# Patient Record
Sex: Male | Born: 1975 | Race: White | Hispanic: No | Marital: Single | Smoking: Current every day smoker
Health system: Southern US, Community
[De-identification: ages and names within clinical notes are randomized; demographics above are authoritative.]

## PROBLEM LIST (undated history)

## (undated) DIAGNOSIS — F1910 Other psychoactive substance abuse, uncomplicated: Secondary | ICD-10-CM

---

## 2014-03-18 ENCOUNTER — Encounter (HOSPITAL_COMMUNITY)
Admission: EM | Disposition: A | Discharge status: Home / Self Care | Payer: Self-pay | Source: Home / Self Care | Attending: Emergency Medicine

## 2014-03-18 ENCOUNTER — Emergency Department (HOSPITAL_COMMUNITY): Payer: Self-pay

## 2014-03-18 ENCOUNTER — Emergency Department (HOSPITAL_COMMUNITY)
Admission: EM | Admit: 2014-03-18 | Discharge: 2014-03-18 | Disposition: A | Discharge status: Home / Self Care | Payer: Self-pay | Attending: Emergency Medicine | Admitting: Emergency Medicine

## 2014-03-18 ENCOUNTER — Emergency Department (HOSPITAL_COMMUNITY): Payer: Self-pay | Admitting: Anesthesiology

## 2014-03-18 ENCOUNTER — Encounter (HOSPITAL_COMMUNITY): Payer: Self-pay | Admitting: Emergency Medicine

## 2014-03-18 DIAGNOSIS — S81811A Laceration without foreign body, right lower leg, initial encounter: Secondary | ICD-10-CM | POA: Insufficient documentation

## 2014-03-18 DIAGNOSIS — Z01811 Encounter for preprocedural respiratory examination: Secondary | ICD-10-CM

## 2014-03-18 DIAGNOSIS — Y929 Unspecified place or not applicable: Secondary | ICD-10-CM | POA: Insufficient documentation

## 2014-03-18 DIAGNOSIS — W312XXA Contact with powered woodworking and forming machines, initial encounter: Secondary | ICD-10-CM | POA: Insufficient documentation

## 2014-03-18 DIAGNOSIS — F17210 Nicotine dependence, cigarettes, uncomplicated: Secondary | ICD-10-CM | POA: Insufficient documentation

## 2014-03-18 HISTORY — PX: I&D EXTREMITY: SHX5045

## 2014-03-18 HISTORY — DX: Other psychoactive substance abuse, uncomplicated: F19.10

## 2014-03-18 LAB — CBC WITH DIFFERENTIAL/PLATELET
Basophils Absolute: 0 10*3/uL (ref 0.0–0.1)
Basophils Relative: 0 % (ref 0–1)
Eosinophils Absolute: 0.1 10*3/uL (ref 0.0–0.7)
Eosinophils Relative: 1 % (ref 0–5)
HCT: 36.2 % — ABNORMAL LOW (ref 39.0–52.0)
Hemoglobin: 12.7 g/dL — ABNORMAL LOW (ref 13.0–17.0)
Lymphocytes Relative: 15 % (ref 12–46)
Lymphs Abs: 1.8 10*3/uL (ref 0.7–4.0)
MCH: 30.3 pg (ref 26.0–34.0)
MCHC: 35.1 g/dL (ref 30.0–36.0)
MCV: 86.4 fL (ref 78.0–100.0)
Monocytes Absolute: 0.7 10*3/uL (ref 0.1–1.0)
Monocytes Relative: 6 % (ref 3–12)
Neutro Abs: 9.1 10*3/uL — ABNORMAL HIGH (ref 1.7–7.7)
Neutrophils Relative %: 78 % — ABNORMAL HIGH (ref 43–77)
Platelets: ADEQUATE 10*3/uL (ref 150–400)
RBC: 4.19 MIL/uL — ABNORMAL LOW (ref 4.22–5.81)
RDW: 12.9 % (ref 11.5–15.5)
WBC: 11.7 10*3/uL — ABNORMAL HIGH (ref 4.0–10.5)

## 2014-03-18 LAB — BASIC METABOLIC PANEL
Anion gap: 12 (ref 5–15)
BUN: 9 mg/dL (ref 6–23)
CO2: 20 mmol/L (ref 19–32)
Calcium: 9.8 mg/dL (ref 8.4–10.5)
Chloride: 107 mmol/L (ref 96–112)
Creatinine, Ser: 0.8 mg/dL (ref 0.50–1.35)
GFR calc Af Amer: >90 mL/min (ref >90)
GFR calc non Af Amer: >90 mL/min (ref >90)
Glucose, Bld: 112 mg/dL — ABNORMAL HIGH (ref 70–99)
Potassium: 5.5 mmol/L — ABNORMAL HIGH (ref 3.5–5.1)
Sodium: 139 mmol/L (ref 135–145)

## 2014-03-18 SURGERY — IRRIGATION AND DEBRIDEMENT EXTREMITY
Anesthesia: General | Laterality: Right

## 2014-03-18 MED ORDER — FENTANYL CITRATE 0.05 MG/ML IJ SOLN
INTRAMUSCULAR | Status: AC
  Filled 2014-03-18: qty 5

## 2014-03-18 MED ORDER — MIDAZOLAM HCL 5 MG/5ML IJ SOLN
INTRAMUSCULAR | Status: DC | PRN
  Administered 2014-03-18 (×2): 2 mg via INTRAVENOUS

## 2014-03-18 MED ORDER — MIDAZOLAM HCL 2 MG/2ML IJ SOLN
INTRAMUSCULAR | Status: AC
  Filled 2014-03-18: qty 2

## 2014-03-18 MED ORDER — FENTANYL CITRATE 0.05 MG/ML IJ SOLN
INTRAMUSCULAR | Status: DC | PRN
  Administered 2014-03-18: 50 ug via INTRAVENOUS
  Administered 2014-03-18: 100 ug via INTRAVENOUS

## 2014-03-18 MED ORDER — PROPOFOL INFUSION 10 MG/ML OPTIME
INTRAVENOUS | Status: DC | PRN
  Administered 2014-03-18: 30 mL via INTRAVENOUS

## 2014-03-18 MED ORDER — SODIUM CHLORIDE 0.9 % IR SOLN
Status: DC | PRN
  Administered 2014-03-18: 3000 mL

## 2014-03-18 MED ORDER — PROPOFOL 10 MG/ML IV BOLUS
INTRAVENOUS | Status: AC
  Filled 2014-03-18: qty 20

## 2014-03-18 MED ORDER — CEFAZOLIN SODIUM-DEXTROSE 2-3 GM-% IV SOLR
INTRAVENOUS | Status: DC | PRN
  Administered 2014-03-18: 2 g via INTRAVENOUS

## 2014-03-18 MED ORDER — MEPERIDINE HCL 25 MG/ML IJ SOLN: 6.2500 mg | INTRAMUSCULAR | Status: DC | PRN

## 2014-03-18 MED ORDER — KETOROLAC TROMETHAMINE 30 MG/ML IJ SOLN
INTRAMUSCULAR | Status: AC
  Filled 2014-03-18: qty 1

## 2014-03-18 MED ORDER — HYDROMORPHONE HCL 1 MG/ML IJ SOLN
1.0000 mg | Freq: Once | INTRAMUSCULAR | Status: AC
Start: 2014-03-18 — End: 2014-03-18
  Administered 2014-03-18: 1 mg via INTRAVENOUS
  Filled 2014-03-18: qty 1

## 2014-03-18 MED ORDER — HYDROMORPHONE HCL 1 MG/ML IJ SOLN
1.0000 mg | Freq: Once | INTRAMUSCULAR | Status: AC
  Administered 2014-03-18: 1 mg via INTRAVENOUS
  Filled 2014-03-18: qty 1

## 2014-03-18 MED ORDER — ONDANSETRON HCL 4 MG/2ML IJ SOLN: 4.0000 mg | Freq: Once | INTRAMUSCULAR | Status: DC | PRN

## 2014-03-18 MED ORDER — KETOROLAC TROMETHAMINE 30 MG/ML IJ SOLN
30.0000 mg | Freq: Once | INTRAMUSCULAR | Status: AC
  Administered 2014-03-18: 30 mg via INTRAVENOUS

## 2014-03-18 MED ORDER — CEFAZOLIN SODIUM-DEXTROSE 2-3 GM-% IV SOLR
INTRAVENOUS | Status: AC
  Filled 2014-03-18: qty 100

## 2014-03-18 MED ORDER — CEFAZOLIN SODIUM 1-5 GM-% IV SOLN
1.0000 g | Freq: Once | INTRAVENOUS | Status: AC
  Administered 2014-03-18: 1 g via INTRAVENOUS
  Filled 2014-03-18: qty 50

## 2014-03-18 MED ORDER — LIDOCAINE-EPINEPHRINE (PF) 1.5 %-1:200000 IJ SOLN
INTRAMUSCULAR | Status: DC | PRN
  Administered 2014-03-18: 30 mL via PERINEURAL

## 2014-03-18 MED ORDER — FENTANYL CITRATE 0.05 MG/ML IJ SOLN
INTRAMUSCULAR | Status: AC
  Administered 2014-03-18: 100 ug via NASAL
  Filled 2014-03-18: qty 2

## 2014-03-18 MED ORDER — ONDANSETRON HCL 4 MG/2ML IJ SOLN
INTRAMUSCULAR | Status: DC | PRN
  Administered 2014-03-18: 4 mg via INTRAVENOUS

## 2014-03-18 MED ORDER — 0.9 % SODIUM CHLORIDE (POUR BTL) OPTIME
TOPICAL | Status: DC | PRN
  Administered 2014-03-18: 1000 mL

## 2014-03-18 MED ORDER — HYDROMORPHONE HCL 1 MG/ML IJ SOLN
2.0000 mg | Freq: Once | INTRAMUSCULAR | Status: AC
  Administered 2014-03-18: 2 mg via INTRAVENOUS
  Filled 2014-03-18: qty 2

## 2014-03-18 MED ORDER — TETANUS-DIPHTH-ACELL PERTUSSIS 5-2.5-18.5 LF-MCG/0.5 IM SUSP
0.5000 mL | Freq: Once | INTRAMUSCULAR | Status: DC
  Filled 2014-03-18: qty 0.5

## 2014-03-18 MED ORDER — HYDROMORPHONE HCL 1 MG/ML IJ SOLN: 0.2500 mg | INTRAMUSCULAR | Status: DC | PRN

## 2014-03-18 MED ORDER — BUPIVACAINE-EPINEPHRINE (PF) 0.5% -1:200000 IJ SOLN
INTRAMUSCULAR | Status: DC | PRN
  Administered 2014-03-18: 30 mL via PERINEURAL

## 2014-03-18 MED ORDER — ONDANSETRON HCL 4 MG/2ML IJ SOLN
INTRAMUSCULAR | Status: AC
  Filled 2014-03-18: qty 2

## 2014-03-18 MED ORDER — KETOROLAC TROMETHAMINE 30 MG/ML IM SOLN: 30.0000 mg | Freq: Once | INTRAMUSCULAR | Status: AC

## 2014-03-18 MED ORDER — PROPOFOL INFUSION 10 MG/ML OPTIME
INTRAVENOUS | Status: DC | PRN
  Administered 2014-03-18: 75 ug/kg/min via INTRAVENOUS

## 2014-03-18 MED ORDER — LACTATED RINGERS IV SOLN
INTRAVENOUS | Status: DC | PRN
  Administered 2014-03-18: 19:00:00 via INTRAVENOUS

## 2014-03-18 SURGICAL SUPPLY — 54 items
BANDAGE ELASTIC 4 VELCRO ST LF (GAUZE/BANDAGES/DRESSINGS) ×2 IMPLANT
BANDAGE ELASTIC 6 VELCRO ST LF (GAUZE/BANDAGES/DRESSINGS) ×2 IMPLANT
BNDG COHESIVE 4X5 TAN STRL (GAUZE/BANDAGES/DRESSINGS) ×2 IMPLANT
BNDG GAUZE ELAST 4 BULKY (GAUZE/BANDAGES/DRESSINGS) ×2 IMPLANT
COVER SURGICAL LIGHT HANDLE (MISCELLANEOUS) ×3 IMPLANT
CUFF TOURNIQUET SINGLE 18IN (TOURNIQUET CUFF) ×1 IMPLANT
CUFF TOURNIQUET SINGLE 24IN (TOURNIQUET CUFF) IMPLANT
CUFF TOURNIQUET SINGLE 34IN LL (TOURNIQUET CUFF) IMPLANT
CUFF TOURNIQUET SINGLE 44IN (TOURNIQUET CUFF) IMPLANT
DRAPE U-SHAPE 47X51 STRL (DRAPES) ×3 IMPLANT
DRSG PAD ABDOMINAL 8X10 ST (GAUZE/BANDAGES/DRESSINGS) ×4 IMPLANT
DURAPREP 26ML APPLICATOR (WOUND CARE) ×1 IMPLANT
ELECT REM PT RETURN 9FT ADLT (ELECTROSURGICAL) ×3
ELECTRODE REM PT RTRN 9FT ADLT (ELECTROSURGICAL) IMPLANT
FACESHIELD WRAPAROUND (MASK) ×3 IMPLANT
FACESHIELD WRAPAROUND OR TEAM (MASK) ×1 IMPLANT
GAUZE SPONGE 4X4 12PLY STRL (GAUZE/BANDAGES/DRESSINGS) ×2 IMPLANT
GAUZE XEROFORM 5X9 LF (GAUZE/BANDAGES/DRESSINGS) ×2 IMPLANT
GLOVE BIOGEL PI IND STRL 8 (GLOVE) ×1 IMPLANT
GLOVE BIOGEL PI INDICATOR 8 (GLOVE) ×2
GLOVE SURG ORTHO 8.0 STRL STRW (GLOVE) ×3 IMPLANT
GOWN STRL REUS W/ TWL LRG LVL3 (GOWN DISPOSABLE) ×2 IMPLANT
GOWN STRL REUS W/ TWL XL LVL3 (GOWN DISPOSABLE) ×1 IMPLANT
GOWN STRL REUS W/TWL LRG LVL3 (GOWN DISPOSABLE) ×6
GOWN STRL REUS W/TWL XL LVL3 (GOWN DISPOSABLE) ×3
HANDPIECE INTERPULSE COAX TIP (DISPOSABLE)
KIT BASIN OR (CUSTOM PROCEDURE TRAY) ×3 IMPLANT
KIT ROOM TURNOVER OR (KITS) ×3 IMPLANT
MANIFOLD NEPTUNE II (INSTRUMENTS) ×3 IMPLANT
NS IRRIG 1000ML POUR BTL (IV SOLUTION) ×3 IMPLANT
PACK ORTHO EXTREMITY (CUSTOM PROCEDURE TRAY) ×3 IMPLANT
PAD ARMBOARD 7.5X6 YLW CONV (MISCELLANEOUS) ×6 IMPLANT
PAD CAST 4YDX4 CTTN HI CHSV (CAST SUPPLIES) IMPLANT
PADDING CAST ABS 4INX4YD NS (CAST SUPPLIES) ×2
PADDING CAST ABS COTTON 4X4 ST (CAST SUPPLIES) IMPLANT
PADDING CAST COTTON 4X4 STRL (CAST SUPPLIES)
SET HNDPC FAN SPRY TIP SCT (DISPOSABLE) IMPLANT
SPONGE LAP 18X18 X RAY DECT (DISPOSABLE) ×5 IMPLANT
SPONGE LAP 4X18 X RAY DECT (DISPOSABLE) ×1 IMPLANT
STOCKINETTE IMPERVIOUS 9X36 MD (GAUZE/BANDAGES/DRESSINGS) ×3 IMPLANT
SUT ETHILON 2 0 FS 18 (SUTURE) ×4 IMPLANT
SUT ETHILON 3 0 PS 1 (SUTURE) IMPLANT
SUT ETHILON 4 0 PS 2 18 (SUTURE) IMPLANT
SUT PROLENE 3 0 PS 2 (SUTURE) IMPLANT
SUT VIC AB 3-0 SH 27 (SUTURE)
SUT VIC AB 3-0 SH 27X BRD (SUTURE) IMPLANT
TOWEL OR 17X24 6PK STRL BLUE (TOWEL DISPOSABLE) ×3 IMPLANT
TOWEL OR 17X26 10 PK STRL BLUE (TOWEL DISPOSABLE) ×3 IMPLANT
TUBE ANAEROBIC SPECIMEN COL (MISCELLANEOUS) IMPLANT
TUBE CONNECTING 12'X1/4 (SUCTIONS) ×1
TUBE CONNECTING 12X1/4 (SUCTIONS) ×2 IMPLANT
UNDERPAD 30X30 INCONTINENT (UNDERPADS AND DIAPERS) ×3 IMPLANT
WATER STERILE IRR 1000ML POUR (IV SOLUTION) ×3 IMPLANT
YANKAUER SUCT BULB TIP NO VENT (SUCTIONS) ×3 IMPLANT

## 2014-03-18 NOTE — Progress Notes (Signed)
Orthopedic Tech Progress Note Patient Details:  Caleb Gilbert 05-Aug-1975 [id number] Fit pt. for crutches and taught use of same. Ortho Devices Type of Ortho Device: Crutches Ortho Device/Splint Interventions: Adjustment   Lesle ChrisGilliland, Karra Pink L 03/18/2014, 9:26 PM

## 2014-03-18 NOTE — Anesthesia Postprocedure Evaluation (Signed)
Anesthesia Post Note  Patient: Caleb Gilbert  Procedure(s) Performed: Procedure(s) (LRB): IRRIGATION AND DEBRIDEMENT CLOSURE LEG (Right)  Anesthesia type: general  Patient location: PACU  Post pain: Pain level controlled  Post assessment: Patient's Cardiovascular Status Stable  Last Vitals:  Filed Vitals:   03/18/14 2030  BP:   Pulse: 54  Temp:   Resp: 12    Post vital signs: Reviewed and stable  Level of consciousness: sedated  Complications: No apparent anesthesia complications

## 2014-03-18 NOTE — ED Notes (Signed)
Pt up in tree and hit right calf with chain saw.

## 2014-03-18 NOTE — Anesthesia Preprocedure Evaluation (Addendum)
Anesthesia Evaluation  Patient identified by MRN, date of birth, ID band Patient awake    Reviewed: Allergy & Precautions, NPO status , Patient's Chart, lab work & pertinent test results  Airway Mallampati: I  TM Distance: >3 FB Neck ROM: Full    Dental  (+) Poor Dentition   Pulmonary Current Smoker,          Cardiovascular     Neuro/Psych    GI/Hepatic   Endo/Other    Renal/GU      Musculoskeletal   Abdominal   Peds  Hematology   Anesthesia Other Findings   Reproductive/Obstetrics                            Anesthesia Physical Anesthesia Plan  ASA: III  Anesthesia Plan: General   Post-op Pain Management:    Induction: Intravenous  Airway Management Planned: Oral ETT  Additional Equipment:   Intra-op Plan:   Post-operative Plan: Extubation in OR  Informed Consent: I have reviewed the patients History and Physical, chart, labs and discussed the procedure including the risks, benefits and alternatives for the proposed anesthesia with the patient or authorized representative who has indicated his/her understanding and acceptance.     Plan Discussed with: CRNA and Surgeon  Anesthesia Plan Comments: (Pt on Methadone. 140mg  per day 70 am 70 pm. Did not take it today.)        Anesthesia Quick Evaluation

## 2014-03-18 NOTE — ED Notes (Signed)
Unable to obtain IV access, pt given intranasal fentanyl per protocol. Pt screaming in pain.

## 2014-03-18 NOTE — H&P (Signed)
Caleb Gilbert is an 39 y.o. male.   Chief Complaint: Right leg pain HPI: Caleb Gilbert is a 39 year old patient who was operating a chain saw around a tree today when the chainsaw hit his right calf posteriorly. Patient sustained a laceration. He presents to the emergency room for further evaluation and management. Patient is in significant amount of pain. He is on methadone for history of substance abuse which may contribute to his pain response. He denies any other orthopedic complaints. There is no fall or any other trauma to his extremities other than the chainsaw which affected his right leg. Patient received tetanus as well as IV antibiotics in the emergency room. He has poor venous access.  Past Medical History  Diagnosis Date  . Substance abuse     History reviewed. No pertinent past surgical history.  History reviewed. No pertinent family history. Social History:  reports that he has been smoking.  He does not have any smokeless tobacco history on file. His alcohol and drug histories are not on file.  Allergies: No Known Allergies   (Not in a hospital admission)  No results found for this or any previous visit (from the past 48 hour(s)). Dg Tibia/fibula Right  03/18/2014   CLINICAL DATA:  Laceration from chainsaw to posterior calf region today. Initial encounter.  EXAM: RIGHT TIBIA AND FIBULA - 2 VIEW  COMPARISON:  None.  FINDINGS: There is no evidence of fracture or other focal bone lesions. Large laceration is seen in the lateral and posterior soft tissues of the mid calf. No radiopaque foreign body is noted.  IMPRESSION: Large soft tissue laceration is noted in the calf tissues. No fracture or dislocation is noted.   Electronically Signed   By: Roque LiasJames  Green M.D.   On: 03/18/2014 14:55    Review of Systems  Constitutional: Negative.   HENT: Negative.   Eyes: Negative.   Respiratory: Negative.   Cardiovascular: Negative.   Gastrointestinal: Negative.   Genitourinary: Negative.    Musculoskeletal: Positive for joint pain.  Skin: Negative.   Neurological: Negative.   Endo/Heme/Allergies: Negative.   Psychiatric/Behavioral: Negative.     Blood pressure 142/80, pulse 59, temperature 98.6 F (37 C), temperature source Oral, resp. rate 13, height 6' (1.829 m), weight 81.647 kg (180 lb), SpO2 100 %. Physical Exam  Constitutional: He appears well-developed.  HENT:  Head: Normocephalic.  Eyes: Pupils are equal, round, and reactive to light.  Neck: Normal range of motion.  Cardiovascular: Normal rate.   Respiratory: Effort normal.  Neurological: He is alert.  Skin: Skin is warm.  Psychiatric: He has a normal mood and affect.   examination of the right leg demonstrates palpable pedal pulse is a laceration around the muscle tendinous junction on the lateral gastroc area measuring approximately 5 by mouth twice with 6 x 3 cm and to his death of a couple centimeters down to the gastroc fascia. Does have a little bit of paresthesias on the distal aspect of the laceration. Knee has no effusion. Skin edges are ragged. Plain radiographs are normal.  Assessment/Plan Impression is complex laceration right leg in a patient who has significant amount of pain and he was also taking methadone. Plan is for operative debridement and loose closure with popliteal block for postop pain control. Risk and benefits discussed with the patient is a discharge home in a soft dressing weightbearing as tolerated with crutches to follow-up with me in 7 days. I will likely send him home also with by mouth antibiotics because  of the laceration.  DEAN,GREGORY SCOTT 03/18/2014, 4:43 PM

## 2014-03-18 NOTE — ED Provider Notes (Signed)
CSN: [id number]     Arrival date & time 03/18/14  1335 History   First MD Initiated Contact with Patient 03/18/14 1336     Chief Complaint  Patient presents with  . Extremity Laceration     (Consider location/radiation/quality/duration/timing/severity/associated sxs/prior Treatment) HPI Patient presents to the emergency department with a chainsaw wound to the posterior aspect of his right lower leg in the Mid calf region.  Patient states that he was cutting limbs out of a tree when the chainsaw kicked back and hit him in the back of the leg.  Patient states he did not fall and has no other trauma, states he applied direct pressure and called EMS.  The patient.  Intranasal fentanyl Past Medical History  Diagnosis Date  . Substance abuse    History reviewed. No pertinent past surgical history. History reviewed. No pertinent family history. History  Substance Use Topics  . Smoking status: Current Every Day Smoker -- 1.00 packs/day  . Smokeless tobacco: Not on file  . Alcohol Use: Not on file    Review of Systems  All other systems negative except as documented in the HPI. All pertinent positives and negatives as reviewed in the HPI.   Allergies  Review of patient's allergies indicates no known allergies.  Home Medications   Prior to Admission medications   Medication Sig Start Date End Date Taking? Authorizing Provider  ibuprofen (ADVIL,MOTRIN) 200 MG tablet Take 200 mg by mouth every 6 (six) hours as needed.   Yes Historical Provider, MD  methadone (DOLOPHINE) 10 MG/ML solution Take 140 mg by mouth See admin instructions. Only takes on clinic days.   Yes Historical Provider, MD   BP 128/66 mmHg  Pulse 65  Temp(Src) 98.6 F (37 C) (Oral)  Resp 14  Ht 6' (1.829 m)  Wt 180 lb (81.647 kg)  BMI 24.41 kg/m2  SpO2 98% Physical Exam  Constitutional: He is oriented to person, place, and time. He appears well-developed and well-nourished. No distress.  HENT:  Head:  Normocephalic and atraumatic.  Eyes: Pupils are equal, round, and reactive to light.  Cardiovascular: Normal rate, regular rhythm and normal heart sounds.  Exam reveals no gallop and no friction rub.   No murmur heard. Pulmonary/Chest: Effort normal and breath sounds normal.  Neurological: He is alert and oriented to person, place, and time.  Skin: Skin is warm and dry.       ED Course  Procedures (including critical care time) Labs Review Labs Reviewed - No data to display  Imaging Review Dg Tibia/fibula Right  03/18/2014   CLINICAL DATA:  Laceration from chainsaw to posterior calf region today. Initial encounter.  EXAM: RIGHT TIBIA AND FIBULA - 2 VIEW  COMPARISON:  None.  FINDINGS: There is no evidence of fracture or other focal bone lesions. Large laceration is seen in the lateral and posterior soft tissues of the mid calf. No radiopaque foreign body is noted.  IMPRESSION: Large soft tissue laceration is noted in the calf tissues. No fracture or dislocation is noted.   Electronically Signed   By: Roque LiasJames  Green M.D.   On: 03/18/2014 14:55    I spoke with Dr. August Saucerean of orthopedic surgery who will come in and evaluate the patient for washout of this deep wound that involves the Achilles tendon.  MDM   Final diagnoses:  None          Carlyle DollyChristopher W Kama Cammarano, PA-C 03/19/14 0559  Elwin MochaBlair Walden, MD 03/20/14 [id number]

## 2014-03-18 NOTE — Transfer of Care (Signed)
Immediate Anesthesia Transfer of Care Note  Patient: Caleb Gilbert  Procedure(s) Performed: Procedure(s): IRRIGATION AND DEBRIDEMENT CLOSURE LEG (Right)  Patient Location: PACU  Anesthesia Type:MAC and Regional  Level of Consciousness: awake, alert , oriented and patient cooperative  Airway & Oxygen Therapy: Patient Spontanous Breathing and Patient connected to nasal cannula oxygen  Post-op Assessment: Report given to RN and Post -op Vital signs reviewed and stable  Post vital signs: Reviewed and stable  Last Vitals:  Filed Vitals:   03/18/14 1815  BP: 122/72  Pulse: 58  Temp:   Resp: 14    Complications: No apparent anesthesia complications

## 2014-03-18 NOTE — Anesthesia Procedure Notes (Addendum)
Anesthesia Regional Block:  Popliteal block  Pre-Anesthetic Checklist: ,, timeout performed, Correct Patient, Correct Site, Correct Laterality, Correct Procedure, Correct Position, site marked, Risks and benefits discussed,  Surgical consent,  Pre-op evaluation,  At surgeon's request and post-op pain management  Laterality: Right  Prep: chloraprep       Needles:  Injection technique: Single-shot  Needle Type: Echogenic Stimulator Needle     Needle Length: 9cm 9 cm Needle Gauge: 21 and 21 G    Additional Needles:  Procedures: ultrasound guided (picture in chart) and nerve stimulator Popliteal block  Nerve Stimulator or Paresthesia:  Response: 0.4 mA,   Additional Responses:   Narrative:  Start time: 03/18/2014 7:00 PM End time: 03/18/2014 7:10 PM Injection made incrementally with aspirations every 5 mL.  Performed by: Personally  Anesthesiologist: Arta BruceSSEY, KEVIN  Additional Notes: Monitors applied. Patient sedated. Sterile prep and drape,hand hygiene and sterile gloves were used. Relevant anatomy identified.Needle position confirmed.Local anesthetic injected incrementally after negative aspiration. Local anesthetic spread visualized around nerve(s). Vascular puncture avoided. No complications. Image printed for medical record.The patient tolerated the procedure well.  Additional Saphenous nerve block performed. 15cc Local Anesthetic mixture placed under ultrasonic guidance along the medio-inferior border of the Sartorious muscle 6 inches above the knee.  No Problems encountered.  Arta BruceKevin Ossey MD    [facility] Date/Time: 03/18/2014 7:35 PM Performed by: Pricilla HolmBILOTTA, Evonna Stoltz Z Pre-anesthesia Checklist: Patient identified, Patient being monitored, Timeout performed, Emergency Drugs available and Suction available Oxygen Delivery Method: Simple face mask

## 2014-03-18 NOTE — Brief Op Note (Signed)
03/18/2014  8:22 PM  PATIENT:  Caleb Gilbert  39 y.o. male  PRE-OPERATIVE DIAGNOSIS:  RIGHT LEG LACERATION  POST-OPERATIVE DIAGNOSIS:  RIGHT LEG LACERATION  PROCEDURE:  Procedure(s): IRRIGATION AND DEBRIDEMENT CLOSURE LEG  SURGEON:  Surgeon(s): Cammy CopaGregory Scott Rainier Feuerborn, MD  ASSISTANT: none  ANESTHESIA:   regional  EBL: 10 ml    Total I/O In: 250 [I.V.:250] Out: 5 [Blood:5]  BLOOD ADMINISTERED: none  DRAINS: none   LOCAL MEDICATIONS USED:  none  SPECIMEN:  No Specimen  COUNTS:  YES  TOURNIQUET:  * No tourniquets in log *  DICTATION: .Other Dictation: Dictation Number [id number]  PLAN OF CARE: Discharge to home after PACU  PATIENT DISPOSITION:  PACU - hemodynamically stable

## 2014-03-18 NOTE — ED Notes (Signed)
Patient transported to X-ray 

## 2014-03-19 NOTE — Op Note (Signed)
NAME:  Caleb Gilbert, Caleb Gilbert              ACCOUNT NO.:  [id number]  MEDICAL RECORD NO.:  [id number]  LOCATION:  [facility]                         FACILITY:  [facility]  PHYSICIAN:  Burnard BuntingG. Scott Berlie Persky, M.D.    DATE OF BIRTH:  Aug 13, 1975  DATE OF PROCEDURE: DATE OF DISCHARGE:  03/18/2014                              OPERATIVE REPORT   PREOPERATIVE DIAGNOSIS:  Right leg complex laceration.  POSTOPERATIVE DIAGNOSIS:  Right leg complex laceration.  PROCEDURE:  Right leg excisional debridement and primary closure, complex leg laceration, measuring approximately 8 x 3.5 cm.  SURGEON:  Burnard BuntingG. Scott Lynnzie Blackson, M.D.  ASSISTANT:  None.  ANESTHESIA:  Propofol plus regional.  INDICATIONS:  [name] is a 39 year old patient with chain saw injury to right leg, presents for operative management after explanation of risks and benefits.  PROCEDURE IN DETAIL:  The patient was brought to the operating room where regional block anesthetic was induced.  Propofol was added.  The patient was placed in lateral decubitus position with left axilla, left peroneal nerve well padded.  Right leg was prepped with Hibiclens saline, draped in a sterile manner, and laceration measuring approximately 8 cm x 3.5 cm was present.  Time-out was called.  Thorough excisional debridement was then performed beginning centrally within the laceration, which did penetrate through the gastrocs fascia.  Debris was removed.  Excisional debridement performed with both the curette and rongeur.  Did not go down to the bone, although the fibula was within the depths of the incision, the soft tissue over the fibula was not penetrated.  There was a branch of the sural nerve, which was lacerated accounting for his paresthesias on the lateral aspect of the ankle. Following thorough excisional debridement of skin, subcutaneous tissue, muscle, and fascia, thorough irrigation with 3 liters of irrigating solution was performed under gravity extension.   Following thorough excisional debridement and irrigation, the incision was loosely reapproximated using a single layer of 2-0 nylon suture.  These were placed in far-near-near-far fashion.  Bulky dressing was applied. The patient tolerated the procedure well without immediate complication. Transferred to the recovery room, discharged home, weightbearing as tolerated beginning tomorrow.     Burnard BuntingG. Scott Danikah Budzik, M.D.     GSD/MEDQ  D:  03/18/2014  T:  03/19/2014  Job:  [id number]

## 2014-03-22 ENCOUNTER — Encounter (HOSPITAL_COMMUNITY): Payer: Self-pay | Admitting: Orthopedic Surgery

## 2014-03-26 ENCOUNTER — Other Ambulatory Visit (HOSPITAL_COMMUNITY): Payer: Self-pay | Admitting: Orthopedic Surgery

## 2014-03-26 ENCOUNTER — Other Ambulatory Visit: Payer: Self-pay | Admitting: Orthopedic Surgery

## 2014-03-26 DIAGNOSIS — I82409 Acute embolism and thrombosis of unspecified deep veins of unspecified lower extremity: Secondary | ICD-10-CM

## 2014-03-26 DIAGNOSIS — M25561 Pain in right knee: Secondary | ICD-10-CM

## 2014-03-26 DIAGNOSIS — M79604 Pain in right leg: Secondary | ICD-10-CM

## 2014-03-29 ENCOUNTER — Ambulatory Visit (HOSPITAL_COMMUNITY): Payer: Self-pay | Attending: Orthopedic Surgery

## 2014-03-29 ENCOUNTER — Telehealth (HOSPITAL_COMMUNITY): Payer: Self-pay | Admitting: Unknown Physician Specialty

## 2016-03-17 IMAGING — DX DG TIBIA/FIBULA 2V*R*
4 series · 4 of 4 positions shown · non-contrast
Comparison: None.

CLINICAL DATA: Laceration from chainsaw to posterior calf region
today. Initial encounter.

EXAM:
RIGHT TIBIA AND FIBULA - 2 VIEW

[tibia ap (1 of 2)]
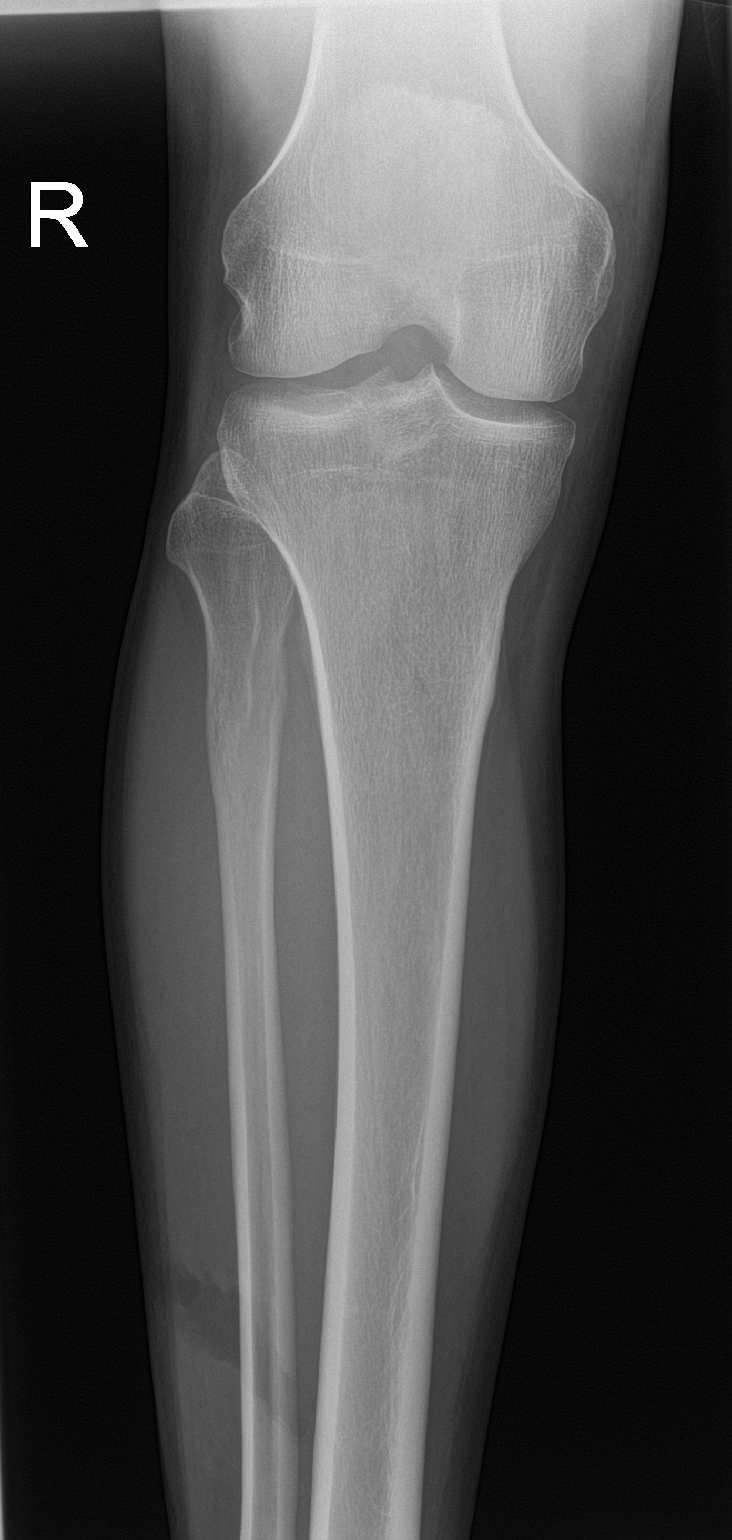

[tibia ap (2 of 2)]
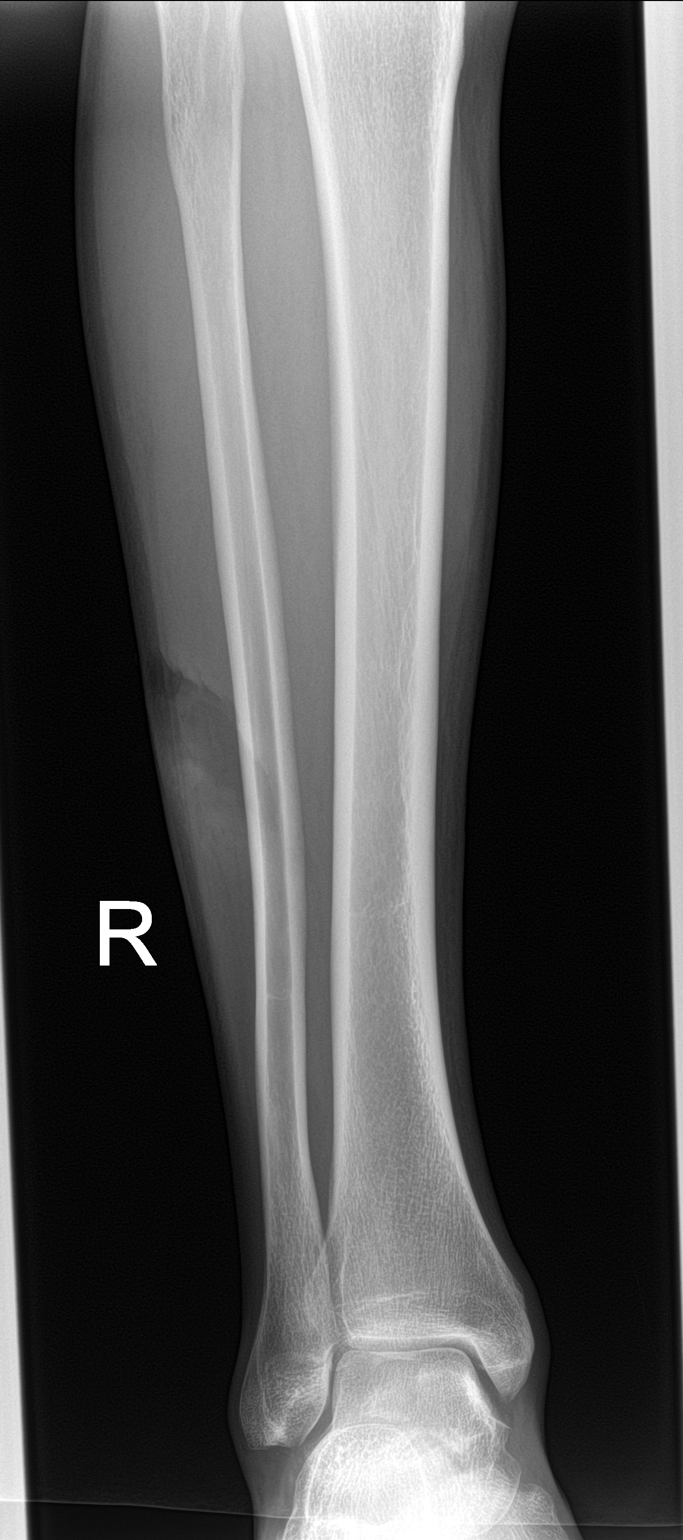

[tibia lat (1 of 2)]
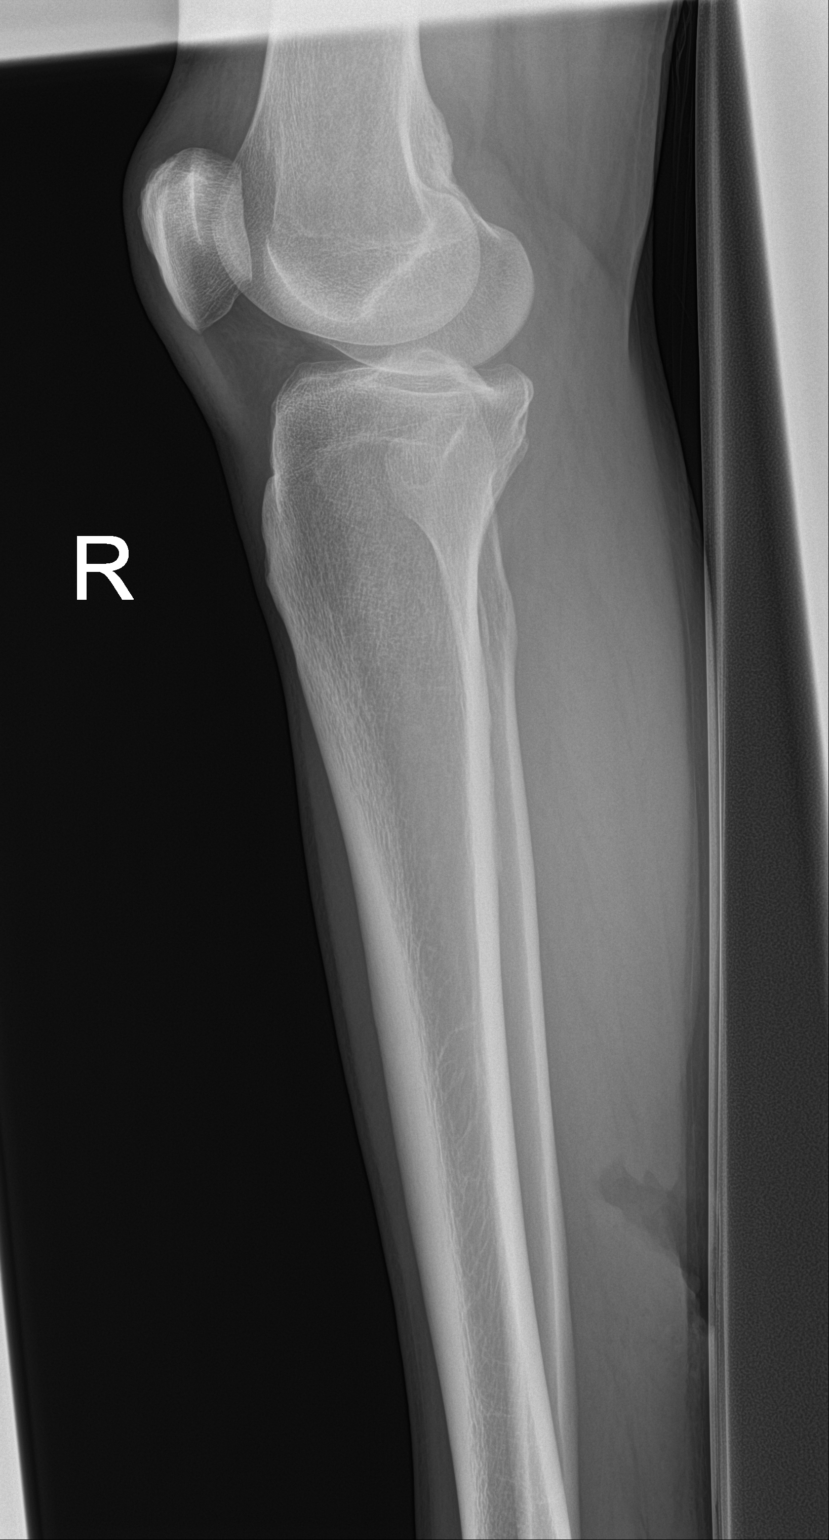

[tibia lat (2 of 2)]
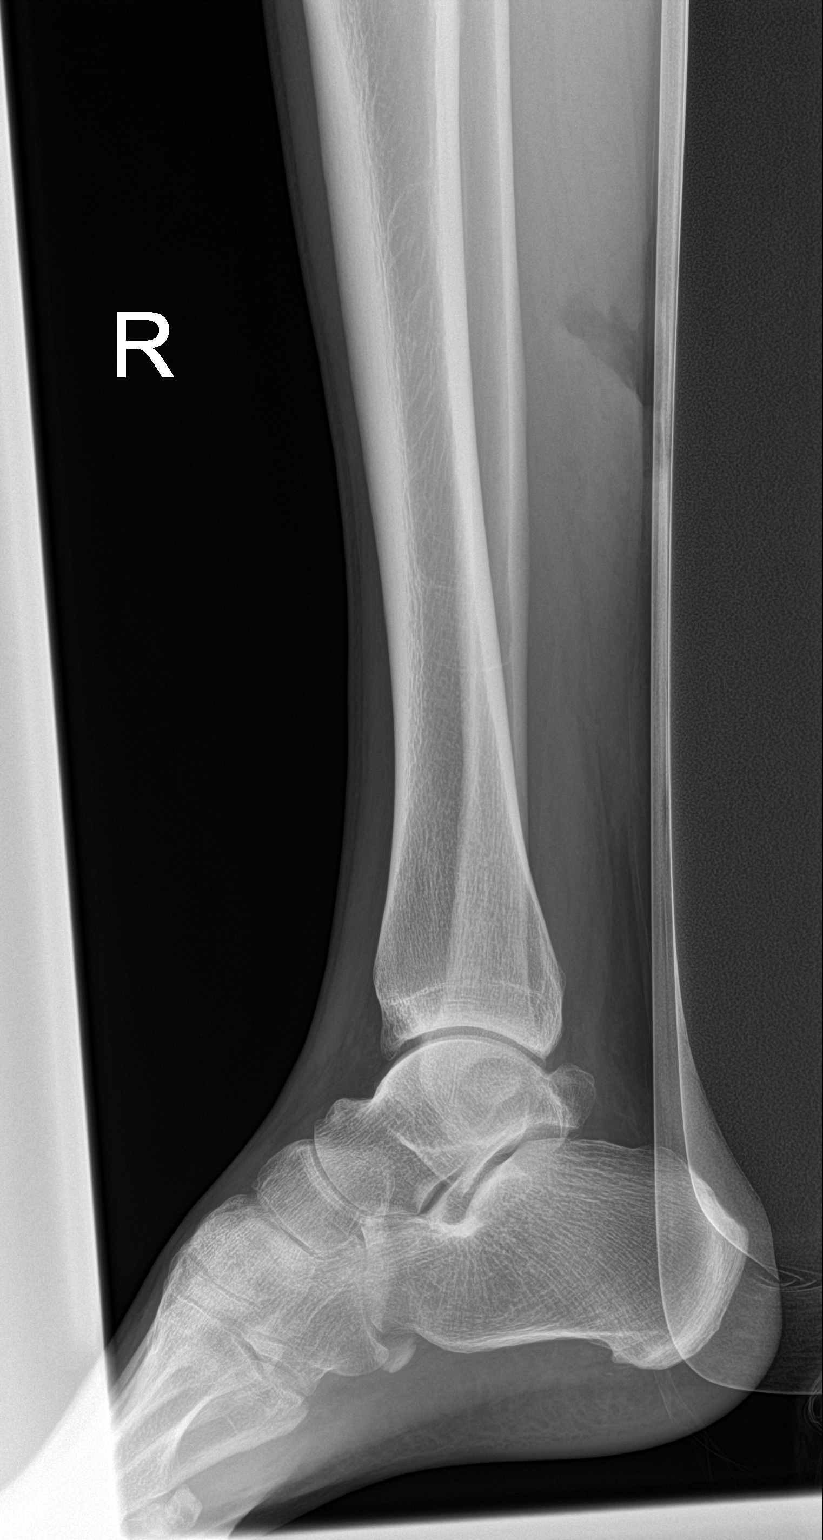

[4 of 4 positions shown; findings below may reference images not displayed]

FINDINGS: There is no evidence of fracture or other focal bone lesions. Large
laceration is seen in the lateral and posterior soft tissues of the
mid calf. No radiopaque foreign body is noted.
IMPRESSION: Large soft tissue laceration is noted in the calf tissues. No
fracture or dislocation is noted.
